# Patient Record
Sex: Female | Born: 1965 | Race: White | Hispanic: Yes | Smoking: Never smoker
Health system: Southern US, Community
[De-identification: ages and names within clinical notes are randomized; demographics above are authoritative.]

## PROBLEM LIST (undated history)

## (undated) DIAGNOSIS — D649 Anemia, unspecified: Secondary | ICD-10-CM

## (undated) HISTORY — DX: Anemia, unspecified: D64.9

---

## 2002-10-10 ENCOUNTER — Emergency Department (HOSPITAL_COMMUNITY): Admission: EM | Admit: 2002-10-10 | Discharge: 2002-10-10 | Payer: Self-pay | Admitting: Emergency Medicine

## 2004-10-01 ENCOUNTER — Encounter: Admission: RE | Admit: 2004-10-01 | Discharge: 2004-10-01 | Payer: Self-pay | Admitting: Orthopedic Surgery

## 2008-10-19 DIAGNOSIS — D649 Anemia, unspecified: Secondary | ICD-10-CM

## 2008-10-19 HISTORY — DX: Anemia, unspecified: D64.9

## 2008-10-19 HISTORY — PX: OTHER SURGICAL HISTORY: SHX169

## 2009-05-18 ENCOUNTER — Encounter (INDEPENDENT_AMBULATORY_CARE_PROVIDER_SITE_OTHER): Payer: Self-pay | Admitting: General Surgery

## 2009-05-18 ENCOUNTER — Inpatient Hospital Stay (HOSPITAL_COMMUNITY): Admission: EM | Admit: 2009-05-18 | Discharge: 2009-05-19 | Payer: Self-pay | Admitting: Emergency Medicine

## 2010-01-13 ENCOUNTER — Emergency Department (HOSPITAL_COMMUNITY): Admission: EM | Admit: 2010-01-13 | Discharge: 2010-01-13 | Payer: Self-pay | Admitting: Emergency Medicine

## 2011-01-25 LAB — URINALYSIS, ROUTINE W REFLEX MICROSCOPIC
Bilirubin Urine: NEGATIVE
Glucose, UA: NEGATIVE mg/dL
Hgb urine dipstick: NEGATIVE
Ketones, ur: NEGATIVE mg/dL
Nitrite: NEGATIVE
Protein, ur: NEGATIVE mg/dL
Specific Gravity, Urine: 1.024 (ref 1.005–1.030)
Urobilinogen, UA: 0.2 mg/dL (ref 0.0–1.0)
pH: 7 (ref 5.0–8.0)

## 2011-01-25 LAB — POCT CARDIAC MARKERS
CKMB, poc: <1 ng/mL — ABNORMAL LOW (ref 1.0–8.0)
CKMB, poc: <1 ng/mL — ABNORMAL LOW (ref 1.0–8.0)
Myoglobin, poc: 37.5 ng/mL (ref 12–200)
Myoglobin, poc: 48.5 ng/mL (ref 12–200)
Troponin i, poc: <0.05 ng/mL (ref 0.00–0.09)
Troponin i, poc: <0.05 ng/mL (ref 0.00–0.09)

## 2011-01-25 LAB — COMPREHENSIVE METABOLIC PANEL
ALT: 33 U/L (ref 0–35)
AST: 71 U/L — ABNORMAL HIGH (ref 0–37)
Albumin: 3.9 g/dL (ref 3.5–5.2)
Alkaline Phosphatase: 80 U/L (ref 39–117)
BUN: 12 mg/dL (ref 6–23)
CO2: 24 mEq/L (ref 19–32)
Calcium: 9.3 mg/dL (ref 8.4–10.5)
Chloride: 106 mEq/L (ref 96–112)
Creatinine, Ser: 0.79 mg/dL (ref 0.4–1.2)
GFR calc Af Amer: >60 mL/min (ref >60)
GFR calc non Af Amer: >60 mL/min (ref >60)
Glucose, Bld: 120 mg/dL — ABNORMAL HIGH (ref 70–99)
Potassium: 4 mEq/L (ref 3.5–5.1)
Sodium: 136 mEq/L (ref 135–145)
Total Bilirubin: 0.2 mg/dL — ABNORMAL LOW (ref 0.3–1.2)
Total Protein: 6.9 g/dL (ref 6.0–8.3)

## 2011-01-25 LAB — POCT PREGNANCY, URINE: Preg Test, Ur: NEGATIVE

## 2011-01-25 LAB — DIFFERENTIAL
Basophils Absolute: 0.1 10*3/uL (ref 0.0–0.1)
Basophils Relative: 1 % (ref 0–1)
Eosinophils Absolute: 0 10*3/uL (ref 0.0–0.7)
Eosinophils Relative: 0 % (ref 0–5)
Lymphocytes Relative: 11 % — ABNORMAL LOW (ref 12–46)
Lymphs Abs: 1.7 10*3/uL (ref 0.7–4.0)
Monocytes Absolute: 0.9 10*3/uL (ref 0.1–1.0)
Monocytes Relative: 6 % (ref 3–12)
Neutro Abs: 12.5 10*3/uL — ABNORMAL HIGH (ref 1.7–7.7)
Neutrophils Relative %: 82 % — ABNORMAL HIGH (ref 43–77)

## 2011-01-25 LAB — CBC
HCT: 34.4 % — ABNORMAL LOW (ref 36.0–46.0)
Hemoglobin: 10.5 g/dL — ABNORMAL LOW (ref 12.0–15.0)
MCHC: 30.4 g/dL (ref 30.0–36.0)
MCV: 68.5 fL — ABNORMAL LOW (ref 78.0–100.0)
Platelets: 393 10*3/uL (ref 150–400)
RBC: 5.02 MIL/uL (ref 3.87–5.11)
RDW: 17.9 % — ABNORMAL HIGH (ref 11.5–15.5)
WBC: 15.2 10*3/uL — ABNORMAL HIGH (ref 4.0–10.5)

## 2011-01-25 LAB — LIPASE, BLOOD: Lipase: 47 U/L (ref 11–59)

## 2011-03-03 NOTE — Op Note (Signed)
NAMEJASHAE, Mallory Washington            ACCOUNT NO.:  [id number]   MEDICAL RECORD NO.:  [id number]          PATIENT TYPE:  INP   LOCATION:  1533                         FACILITY:  [facility]   PHYSICIAN:  Sharlet Salina T. Hoxworth, M.D.DATE OF BIRTH:  1966/02/01   DATE OF PROCEDURE:  05/18/2009  DATE OF DISCHARGE:                               OPERATIVE REPORT   PREOPERATIVE DIAGNOSES:  Cholelithiasis, acute cholecystitis.   POSTOPERATIVE DIAGNOSES:  Cholelithiasis, acute cholecystitis.   SURGICAL PROCEDURES:  Laparoscopic cholecystectomy with intraoperative  cholangiogram.   SURGEON:  Dr. Johna Sheriff.   ASSISTANT:  Dr. Karie Soda.   ANESTHESIA:  General.   BRIEF HISTORY:  Ms. Warnick is a 45 year old female who presents with  acute right upper quadrant abdominal pain and tenderness persistent  after treatment in the emergency room.  Gallbladder ultrasound shows  multiple gallstones and some thickening of the gallbladder wall.  I have  recommended proceeding with laparoscopic cholecystectomy with  cholangiogram.  Through an interpreter, the nature of the procedure, its  indications, risks of anesthetic risks, bleeding, infection and bile  leak were discussed and understood.  She is now brought to the operating  room for this procedure.   DESCRIPTION OF OPERATION:  The patient was brought to the operating room  and placed in the supine position on the operating table and general  orotracheal anesthesia was induced.  She received preoperative IV  antibiotics and PAS were in place.  Correct patient and procedure were  verified.  The abdomen was widely sterilely prepped and draped.  Local  anesthesia was used to infiltrate the trocar sites.  An open Hassan  technique with a 10 mm trocar was used at the umbilicus and the standard  four-port placement used.  The gallbladder was edematous, somewhat  thickened and packed with stones.  The fundus was grasped and elevated  over the liver and then  retracted inferolaterally.  Some chronic  adhesions of omentum and the duodenum were carefully taken down.  The  infundibulum was then retracted inferolaterally.  Peritoneum anterior-  posterior to Calot's triangle was incised.  Fibrofatty tissue was  stripped off the neck of the gallbladder toward the porta hepatis.  The  cystic artery, actually the anterior and posterior branch, were isolated  and Calot's triangle divided between proximal and distal clips.  The  gallbladder cystic junction was dissected 360 degrees.  When the anatomy  was clear, the cystic duct was clipped at the gallbladder junction and  operative cholangiogram obtained through the cystic duct.  This showed  good filling of the normal common bile duct and intrahepatic ducts and  free flow into the duodenum and no filling defects.  Following this, the  cholangiocath was removed and the cystic duct was doubly clipped  proximally and divided.  The gallbladder was then dissected free from  its bed using hook cautery.  This was placed in an EndoCatch bag and  brought out the umbilical incision.  Multiple large stones were removed  and the gallbladder extracted.  The mattress suture was secured at the  umbilicus.  Skin incisions were closed with subcuticular Monocryl and  Dermabond.  Sponges counts were correct.  The patient was taken to the  recovery room in good condition.      Lorne Skeens. Hoxworth, M.D.  Electronically Signed     BTH/MEDQ  D:  05/18/2009  T:  05/18/2009  Job:  [id number]

## 2011-03-03 NOTE — H&P (Signed)
Mallory, Washington            ACCOUNT NO.:  [id number]   MEDICAL RECORD NO.:  [id number]          PATIENT TYPE:  EMS   LOCATION:  ED                           FACILITY:  [facility]   PHYSICIAN:  Lorne Skeens. Hoxworth, M.D.DATE OF BIRTH:  October 27, 1965   DATE OF ADMISSION:  05/17/2009  DATE OF DISCHARGE:                              HISTORY & PHYSICAL   CHIEF COMPLAINT:  Abdominal pain, nausea, vomiting.   HISTORY:  [name] is a 45 year old Hispanic female who had the  sudden onset today of severe epigastric and right upper quadrant  abdominal pain.  This was associated soon after with nausea and  vomiting.  The pain has persisted, described as quite severe, only  partially relieved with medications in the emergency room.  She has been  having some intermittent milder episodes of pain self-limited over the  last several months for which she has not sought medical attention.  Bowel movements are normal.  No fever, chills, jaundice.  No previous GI  or abdominal history.   PAST MEDICAL HISTORY:  Denies previous hospitalizations, serious illness  or surgery.   MEDICATIONS:  None.   ALLERGIES:  NONE.   SOCIAL HISTORY:  Not employed.  Does not smoke cigarettes or drink  alcohol.   FAMILY HISTORY:  Noncontributory.   REVIEW OF SYSTEMS:  GENERAL:  No fever, chills, weight stable.  RESPIRATORY:  No shortness of breath, cough, history of lung disease.  CARDIAC:  No history of heart disease, chest pain, palpitations,  swelling.  ABDOMEN/GI:  As above.  GU:  No urinary burning, frequency.  MUSCULOSKELETAL:  No joint pain, no swelling.  NEUROLOGIC:  No numbness,  weakness, syncope.   PHYSICAL EXAMINATION:  GENERAL:  She is a mildly overweight female,  somewhat drowsy with pain medication, uncomfortable.  SKIN:  Warm, dry.  No rash or infection.  HEENT:  Sclerae nonicteric.  Oropharynx clear.  No thyromegaly or  masses.  LYMPH NODES:  No cervical, subclavicular or inguinal nodes  palpable.  LUNGS:  Clear without wheezing or increased work of breathing.  CARDIAC:  Regular rate and rhythm.  No murmurs.  No edema.  Peripheral  pulse intact.  ABDOMEN:  Nondistended.  No scars.  No hernias.  There is  well-localized epigastric and right upper quadrant tenderness with  guarding.  No discernible masses or organomegaly.  EXTREMITIES:  No joint swelling or deformity.  NEUROLOGIC:  Alert, oriented x4.  Motor and sensory exams are grossly  normal.   LABORATORY:  White count elevated at 15.2, hemoglobin 10.5.  Pregnancy  negative.  UA negative.  Electrolytes normal.  LFTs abnormal only for  SGOT of 71.   DIAGNOSTICS:  Ultrasound of the gallbladder is reviewed which shows  multiple gallstones and mild thickening of the gallbladder wall, normal  common bile duct.   ASSESSMENT/PLAN:  Cholelithiasis and probable early acute cholecystitis.  The patient has persistent pain and tenderness after treatment in the  emergency room and elevated white count.  She will be admitted for IV  fluid, antibiotics and urgent cholecystectomy.      Lorne Skeens. Hoxworth, M.D.  Electronically  Signed     BTH/MEDQ  D:  05/17/2009  T:  05/18/2009  Job:  [id number]

## 2012-10-13 ENCOUNTER — Emergency Department (HOSPITAL_COMMUNITY): Payer: Self-pay

## 2012-10-13 ENCOUNTER — Encounter (HOSPITAL_COMMUNITY): Payer: Self-pay | Admitting: Emergency Medicine

## 2012-10-13 ENCOUNTER — Emergency Department (HOSPITAL_COMMUNITY)
Admission: EM | Admit: 2012-10-13 | Discharge: 2012-10-13 | Disposition: A | Discharge status: Home / Self Care | Payer: Self-pay | Attending: Emergency Medicine | Admitting: Emergency Medicine

## 2012-10-13 DIAGNOSIS — W108XXA Fall (on) (from) other stairs and steps, initial encounter: Secondary | ICD-10-CM | POA: Insufficient documentation

## 2012-10-13 DIAGNOSIS — S139XXA Sprain of joints and ligaments of unspecified parts of neck, initial encounter: Secondary | ICD-10-CM | POA: Insufficient documentation

## 2012-10-13 DIAGNOSIS — Z87442 Personal history of urinary calculi: Secondary | ICD-10-CM | POA: Insufficient documentation

## 2012-10-13 DIAGNOSIS — S161XXA Strain of muscle, fascia and tendon at neck level, initial encounter: Secondary | ICD-10-CM

## 2012-10-13 DIAGNOSIS — Y929 Unspecified place or not applicable: Secondary | ICD-10-CM | POA: Insufficient documentation

## 2012-10-13 DIAGNOSIS — Y939 Activity, unspecified: Secondary | ICD-10-CM | POA: Insufficient documentation

## 2012-10-13 MED ORDER — HYDROCODONE-ACETAMINOPHEN 5-325 MG PO TABS: 1.0000 | ORAL_TABLET | Freq: Four times a day (QID) | ORAL | Status: DC | PRN

## 2012-10-13 MED ORDER — IBUPROFEN 800 MG PO TABS: 800.0000 mg | ORAL_TABLET | Freq: Three times a day (TID) | ORAL | Status: DC | PRN

## 2012-10-13 NOTE — ED Provider Notes (Signed)
History   This chart was scribed for Ebbie Ridge, PA, by Leone Payor, ED Scribe. This patient was seen in room WTR9/WTR9 and the patient's care was started at 1137.   CSN: [id number]  Arrival date & time 10/13/12  1041   First MD Initiated Contact with Patient 10/13/12 1137      Chief Complaint  Patient presents with  . Fall  . Neck Pain     The history is provided by the patient. No language interpreter was used.    Adrianna Dudas is a 46 y.o. female who presents to the Emergency Department complaining of new, constant, unchanged neck pain that radiates down the arm starting 2 weeks ago after falling down two stairs. Pt denies LOC, vomiting, nausea, lower back pain, numbness, or weakness. Patient has no used any treatment prior to arrival. The patient states that she did not take anything prior to arrival. Movement makes the pain worse.   Pt denies smoking and alcohol use. History reviewed. No pertinent past medical history.  Past Surgical History  Procedure Date  . Kidney stone removal     History reviewed. No pertinent family history.  History  Substance Use Topics  . Smoking status: Never Smoker   . Smokeless tobacco: Not on file  . Alcohol Use: No    No OB history provided.   Review of Systems All other systems negative except as documented in the HPI. All pertinent positives and negatives as reviewed in the HPI.      Allergies  Review of patient's allergies indicates no known allergies.  Home Medications  No current outpatient prescriptions on file.  BP 108/71  Pulse 75  Temp 98.6 F (37 C) (Oral)  Resp 18  SpO2 98%  LMP 09/05/2012  Physical Exam  Nursing note and vitals reviewed. Constitutional: She is oriented to person, place, and time. She appears well-developed and well-nourished. No distress.  HENT:  Head: Normocephalic and atraumatic.  Eyes: Pupils are equal, round, and reactive to light.  Neck: Normal range of motion. Neck supple.  No tracheal deviation present.  Cardiovascular: Normal rate, regular rhythm and normal heart sounds.   Pulmonary/Chest: Effort normal and breath sounds normal. No respiratory distress.  Musculoskeletal: Normal range of motion.       Cervical back: She exhibits tenderness and pain. She exhibits normal range of motion, no bony tenderness, no deformity and no spasm.       Tenderness along lateral left neck.  Strength is normal, sensation is normal.     Lower and mid back are non tender.  Neurological: She is alert and oriented to person, place, and time. She has normal reflexes. She displays normal reflexes. She exhibits normal muscle tone. Coordination normal.  Skin: Skin is warm and dry.  Psychiatric: She has a normal mood and affect. Her behavior is normal.    ED Course  Procedures (including critical care time)  DIAGNOSTIC STUDIES: Oxygen Saturation is 98% on room air, normal by my interpretation.    COORDINATION OF CARE:  12:19 PMDiscussed treatment plan which includes use of hot compreses with pt at bedside and pt agreed to plan. Patient has no neurological deficits . She also has normal reflexes as well. The patient is asked to follow up at an urgent care.      MDM  I personally performed the services described in this documentation, which was scribed in my presence. The recorded information has been reviewed and is accurate.    Carlyle Dolly,  PA-C 10/13/12 1234

## 2012-10-13 NOTE — ED Provider Notes (Signed)
Medical screening examination/treatment/procedure(s) were performed by non-physician practitioner and as supervising physician I was immediately available for consultation/collaboration.   Prescilla Monger M Lanesha Azzaro, MD 10/13/12 1615 

## 2012-10-13 NOTE — ED Notes (Signed)
Patient states that she feel about 2 weeks ago, she started to feel some swelling to her left neck last night and started to have pain.

## 2016-04-03 ENCOUNTER — Ambulatory Visit (INDEPENDENT_AMBULATORY_CARE_PROVIDER_SITE_OTHER): Payer: Self-pay | Admitting: Internal Medicine

## 2016-04-03 ENCOUNTER — Encounter: Payer: Self-pay | Admitting: Internal Medicine

## 2016-04-03 VITALS — BP 108/72 | HR 77 | Resp 16 | Ht 60.5 in | Wt 176.0 lb

## 2016-04-03 DIAGNOSIS — N950 Postmenopausal bleeding: Secondary | ICD-10-CM

## 2016-04-03 DIAGNOSIS — Z658 Other specified problems related to psychosocial circumstances: Secondary | ICD-10-CM

## 2016-04-03 DIAGNOSIS — K029 Dental caries, unspecified: Secondary | ICD-10-CM

## 2016-04-03 DIAGNOSIS — F439 Reaction to severe stress, unspecified: Secondary | ICD-10-CM

## 2016-04-03 DIAGNOSIS — G44209 Tension-type headache, unspecified, not intractable: Secondary | ICD-10-CM

## 2016-04-03 DIAGNOSIS — H7293 Unspecified perforation of tympanic membrane, bilateral: Secondary | ICD-10-CM

## 2016-04-03 NOTE — Progress Notes (Signed)
Subjective:    Patient ID: Mallory Washington, female    DOB: 10/21/1965, 50 y.o.   MRN: [id number]  HPI  New patient  1.  Headaches:  Started 2 months ago.  Bifrontal.  Feels like her head will explode. Denies throbbing.   Having the headaches twice weekly.  Last 10-20 minutes (later states 2 hours), lies down, may fall asleep, and when awakens, headache is either gone or better.   Has not taken any medication for the headache. No aura.   No photophobia or phonophobia. No nausea or vomiting.   No visual changes Does have glasses that she feels work for her. No associated numbness, tingling, or weakness in her body elsewhere. No precipitating factors of which she is aware. Little to know caffeine intake/chocolate during the day. Denies stress.   PHQ9 measures 13. Husband died of cancer in 2010. 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 Does not feel like supervisor understands.  2.  Postmenopausal bleeding:  Has not had a period for 4 years and then had a period last week, beginning, June 9th.  Lasted through June 11th.  Did not really go through night sweats or hot flashes. Did not have any premenstrual symptoms with this.  Bleeding was heavy though denies passing clots. No pelvic discomfort.  No intercourse before bleeding started--not in some time.   3.  Had some heartburn and nausea 2 days ago.  Not a recurring problem. Her symptoms have resolved.   No current outpatient prescriptions on file.   No Known Allergies   Past Medical History  Diagnosis Date  . Anemia 2010    from menometrorrhagia at the time   Past Surgical History  Procedure Laterality Date  . Kidney stone removal Right 2010   Family History  Problem Relation Age of Onset  . Cancer Father     prostate cancer  . Diabetes Sister    Social History   Social History  . Marital Status: Widowed    Spouse Name: N/A  . Number of Children: 3  . Years of Education: 3    Occupational History  . Packaging at [employer]    Social History Main Topics  . Smoking status: Never Smoker   . Smokeless tobacco: Never Used  . Alcohol Use: No  . Drug Use: No  . Sexual Activity: No   Other Topics Concern  . Not on file   Social History Narrative   [place]   [place]   Widowed   Lives at home with son.          Review of Systems     Objective:   Physical Exam NAD HEENT:  PERRL, EOMI, discs sharp, TMs with possible perforations bilaterally, the left in particular appears to have a posterior perforation.  Both with scarring anteriorly.  Throat without injection, right upper posterior molar with cavity Neck:  Supple, no adenopathy, no thyromegaly Chest:  CTA CV:  RRR with normal S1 and S2, No S3, S4 or murmur, no carotid bruits, Carotid, radial and DP pulses normal and equal Abd:  Obese, + BS, No HSM or masses, NT.   GU:  NOrmal external genitalia;  Scant old blood in vaginal canal.  Unable to fully visualize cervix.  On bimanual, unable to make contact with cervix and due to abdominal size, unable to adequately feel top of uterine fundus.  NT, No adnexal mass or tenderness. Neuro:  A and O x  3, CN II-XII grossly intact, Motor 5/5, DTRs 2+/4, sensory grossly normal.        Assessment & Plan:  1.  Postmenopausal bleeding:  Exam suboptimal.  Check pelvic ultrasound.  LIkely will need Gyn referral and endometrial biopsy.  2.  Headache:  sounds like a muscle tension HA.  Recommend Ibuprofen as needed as well as counseling with N. Terrilee Croak, LCSW.  Unable to perform warm hand off today.  No concerning findings in history or on exam  3.  GI complaints:  resolved  4.  Perforated TMs:  ENT referral.  5.  Dental cavity:  Dental referral.

## 2016-04-03 NOTE — Patient Instructions (Signed)
Ibuprofen 200 mg pastillas, 2-4 pastillas cada 6 horas a necesita para dolor de Turkmenistancabeza.  Siempre tome Ibuprofen con comida

## 2016-04-16 ENCOUNTER — Other Ambulatory Visit: Payer: PRIVATE HEALTH INSURANCE | Admitting: Licensed Clinical Social Worker

## 2016-04-20 ENCOUNTER — Ambulatory Visit (INDEPENDENT_AMBULATORY_CARE_PROVIDER_SITE_OTHER): Payer: Self-pay | Admitting: Licensed Clinical Social Worker

## 2016-04-20 DIAGNOSIS — F439 Reaction to severe stress, unspecified: Secondary | ICD-10-CM

## 2016-04-20 DIAGNOSIS — Z658 Other specified problems related to psychosocial circumstances: Secondary | ICD-10-CM

## 2016-04-22 NOTE — Progress Notes (Signed)
   THERAPY PROGRESS NOTE  Session Time: 45min  Participation Level: Active  Behavioral Response: Neat and Well GroomedAlertDepressed  Type of Therapy: Individual Therapy  Treatment Goals addressed: Coping  Interventions: Motivational Interviewing and Supportive  Summary: Mallory MayerDelfina Washington is a 50 y.o. female who presents with a slightly depressed mood and appropriate affect. She reported that she is seeking counseling due to ongoing stress and depression related to the death of her husband in 2010. She shared about the many ways that he was a good husband and father to their three children. She expressed her continued hurt and anger towards him, though, for the way that he acted during the last months of his cancer. Mallory Washington shared that her husband had started to cancel his doctor's appointments but lied to the family, telling them that he had attended and that he was getting better when in fact he was getting much worse. She shared that even in the last few days before he died, he was trying to lie to her about the seriousness of his condition. Mallory Washington reported that she lost the house after his death because she could not pay the mortgage. She shared that she and her children are slowly picking up the pieces and moving on with their lives, though it has been very difficult.    Suicidal/Homicidal: Nowithout intent/plan  Therapist Response: LCSW began the clinical assessment but was unable to finish due to time constraints. LCSW utilized supportive counseling techniques throughout the session in order to validate emotions and encourage open expression of emotion. LCSW and Mallory Washington processed about her grief process and the impact of her husband's death on the whole family.  Plan: Return again in 2 weeks.  Diagnosis: Axis I: See current hospital problem list    Axis II: No diagnosis    Mallory Simmeratosha Vastie Douty, LCSW 04/22/2016

## 2016-05-04 ENCOUNTER — Other Ambulatory Visit: Payer: PRIVATE HEALTH INSURANCE | Admitting: Licensed Clinical Social Worker

## 2016-05-22 ENCOUNTER — Ambulatory Visit (INDEPENDENT_AMBULATORY_CARE_PROVIDER_SITE_OTHER): Payer: Self-pay | Admitting: Internal Medicine

## 2016-05-22 ENCOUNTER — Ambulatory Visit: Payer: PRIVATE HEALTH INSURANCE | Admitting: Internal Medicine

## 2016-05-22 ENCOUNTER — Encounter: Payer: Self-pay | Admitting: Internal Medicine

## 2016-05-22 VITALS — BP 104/64 | HR 64 | Resp 16 | Ht 61.0 in | Wt 177.0 lb

## 2016-05-22 DIAGNOSIS — H547 Unspecified visual loss: Secondary | ICD-10-CM

## 2016-05-22 DIAGNOSIS — K056 Periodontal disease, unspecified: Secondary | ICD-10-CM

## 2016-05-22 NOTE — Progress Notes (Signed)
   Subjective:    Patient ID: Mallory Washington, female    DOB: [birth year], 50 y.o.   MRN: [id number]  HPI   1.  Dental concerns:  Discussed that we did send in the referral in June.  We will check to see where she is on the waiting list.   Gums bleeding when she brushes her teeth.  Has had this for the past 3 weeks.   Having some pain , but better with Tylenol  500 mg twice daily as needed.   2.  Vision concern;  Has to hold reading material away from her face to be able to focus.  Has dealt with this for 1 month  3.  No longer having heavy periods, actually no periods at all.    Current Meds  Medication Sig  . acetaminophen (TYLENOL) 500 MG tablet Take 1,000 mg by mouth daily.   No Known Allergies    Review of Systems     Objective:   Physical Exam  NAD HEENT:  PERRL, EOMI, discs sharp, Oral exam with mildly inflamed and swollen gingiva.  No fluctuance. Neck:  Supple, no adenopathy Chest:  CTA CV:  RRR without murmur or rub, radial pulses normal and equal          Assessment & Plan:  1.   Periodontal Disease:  Checking into why such a delay with dental referral.  In meantime, to consider going to [facility] dental hygienist school for cleaning.  Brush twice daily and floss daily  2.  Visual complaints:  Optometry evaluation.  Likely presbypia  3.  Previous postmenopausal bleeding:  Still waiting for [facility] slot for pelvic U/S, though no longer with bleeding.

## 2016-05-22 NOTE — Patient Instructions (Addendum)
Try [employer] dental hygienist school for cleaning while waiting for dentist referral Call if you do not hear about the pelvic ultrasound in next month

## 2016-05-31 ENCOUNTER — Telehealth: Payer: Self-pay

## 2016-05-31 NOTE — Telephone Encounter (Signed)
[facility] do I need to try to schedule it with [facility]?

## 2016-06-03 NOTE — Telephone Encounter (Signed)
Grover CanavanKrystal, I have no information regarding this referral appointment to date. Thank you, Dennie BiblePat

## 2016-07-03 NOTE — Telephone Encounter (Signed)
Will this have to go back out on the first of October?

## 2017-11-30 ENCOUNTER — Emergency Department (HOSPITAL_COMMUNITY)
Admission: EM | Admit: 2017-11-30 | Discharge: 2017-11-30 | Disposition: A | Discharge status: Home / Self Care | Payer: Self-pay | Attending: Emergency Medicine | Admitting: Emergency Medicine

## 2017-11-30 ENCOUNTER — Encounter (HOSPITAL_COMMUNITY): Payer: Self-pay

## 2017-11-30 ENCOUNTER — Emergency Department (HOSPITAL_COMMUNITY): Payer: Self-pay

## 2017-11-30 ENCOUNTER — Other Ambulatory Visit: Payer: Self-pay

## 2017-11-30 DIAGNOSIS — Y929 Unspecified place or not applicable: Secondary | ICD-10-CM | POA: Insufficient documentation

## 2017-11-30 DIAGNOSIS — Y939 Activity, unspecified: Secondary | ICD-10-CM | POA: Insufficient documentation

## 2017-11-30 DIAGNOSIS — Y999 Unspecified external cause status: Secondary | ICD-10-CM | POA: Insufficient documentation

## 2017-11-30 DIAGNOSIS — S8264XA Nondisplaced fracture of lateral malleolus of right fibula, initial encounter for closed fracture: Secondary | ICD-10-CM | POA: Insufficient documentation

## 2017-11-30 DIAGNOSIS — X500XXA Overexertion from strenuous movement or load, initial encounter: Secondary | ICD-10-CM | POA: Insufficient documentation

## 2017-11-30 MED ORDER — TRAMADOL HCL 50 MG PO TABS: 50.0000 mg | ORAL_TABLET | Freq: Four times a day (QID) | ORAL | 0 refills | Status: AC | PRN

## 2017-11-30 MED ORDER — TRAMADOL HCL 50 MG PO TABS
50.0000 mg | ORAL_TABLET | Freq: Once | ORAL | Status: AC
  Administered 2017-11-30: 50 mg via ORAL
  Filled 2017-11-30: qty 1

## 2017-11-30 NOTE — ED Triage Notes (Signed)
Patient reports that she fell in the bathtub a few months ago and is now experiencing pain and swelling. Pain is worse with weight bearing.

## 2017-11-30 NOTE — ED Notes (Addendum)
Pt reports that she fell in the shower in October, and has been having intermittent pain but pt dtr states that she has been putting off being evaluated as she did not believe that it was serious. Pt reports 4/10, pt denies taking medication for pain.Rt ankle appears swollen and pt reports some numbness.

## 2017-11-30 NOTE — ED Provider Notes (Signed)
[facility]-EMERGENCY DEPT Provider Note   CSN: [id number] Arrival date & time: 11/30/17  1752     History   Chief Complaint Chief Complaint  Patient presents with  . Ankle Pain    HPI Mallory Washington is a 52 y.o. female who presents to the emergency department today for right ankle pain.  Patient notes that she fell in the shower in October, causing an inversion type injury to her right ankle.  Since then she has had pain and swelling of the lateral malleolus that has worsened with ambulation.  She notes that she has had intermittent pain to this area but is put off seeing a doctor because she did not think it was serious.  The patient notes a few days ago she rolled the ankle again which cause a large increase in her pain which is why she presents today.  She reports her current pain level is 4/10.  She has not been taking anything for her symptoms.  She does report some swelling to the lateral malleolus and some tingling/numbness along the fourth and fifth digit.  She denies any fever or weakness.  HPI  Past Medical History:  Diagnosis Date  . Anemia 2010   from menometrorrhagia at the time    There are no active problems to display for this patient.   Past Surgical History:  Procedure Laterality Date  . kidney stone removal Right 2010    OB History    No data available       Home Medications    Prior to Admission medications   Medication Sig Start Date End Date Taking? Authorizing Provider  acetaminophen (TYLENOL) 500 MG tablet Take 1,000 mg by mouth daily.    [provider]    Family History Family History  Problem Relation Age of Onset  . Cancer Father        prostate cancer  . Diabetes Sister     Social History Social History   Tobacco Use  . Smoking status: Never Smoker  . Smokeless tobacco: Never Used  Substance Use Topics  . Alcohol use: No    Alcohol/week: 0.0 oz  . Drug use: No     Allergies   Patient has  no known allergies.   Review of Systems Review of Systems  Musculoskeletal: Positive for arthralgias and joint swelling.  Skin: Negative for color change and wound.  Neurological: Negative for weakness and numbness.     Physical Exam Updated Vital Signs BP 127/79 (BP Location: Right Arm)   Pulse 76   Temp 97.8 F (36.6 C) (Oral)   Resp 16   Ht 5\' 4"  (1.626 m)   Wt 72.6 kg (160 lb)   LMP 02/03/2016 (Approximate)   SpO2 98%   BMI 27.46 kg/m   Physical Exam  Constitutional: She appears well-developed and well-nourished.  HENT:  Head: Normocephalic and atraumatic.  Right Ear: External ear normal.  Left Ear: External ear normal.  Eyes: Conjunctivae are normal. Right eye exhibits no discharge. Left eye exhibits no discharge. No scleral icterus.  Cardiovascular:  Pulses:      Dorsalis pedis pulses are 2+ on the right side.       Posterior tibial pulses are 2+ on the right side.  No edema of the lower legs.   Pulmonary/Chest: Effort normal. No respiratory distress.  Musculoskeletal:       Right knee: Normal. She exhibits normal range of motion. No tenderness found.       Right  ankle: She exhibits decreased range of motion (2/2 pain) and swelling (lateral malleolus). She exhibits no ecchymosis. Tenderness. Lateral malleolus tenderness found. Achilles tendon normal. Achilles tendon exhibits no pain and normal Thompson's test results.       Right foot: There is normal range of motion, no tenderness and no bony tenderness.  No TTP of the upper fibula. Neurovascularly intact distally. Compartments soft above and below affected joint.   Neurological: She is alert. She has normal strength. No sensory deficit.  Skin: Skin is warm, dry and intact. Capillary refill takes less than 2 seconds. No erythema. No pallor.  No skin erythema or heat.   Psychiatric: She has a normal mood and affect.  Nursing note and vitals reviewed.    ED Treatments / Results  Labs (all labs ordered are  listed, but only abnormal results are displayed) Labs Reviewed - No data to display  EKG  EKG Interpretation None       Radiology Dg Ankle Complete Right  Result Date: 11/30/2017 CLINICAL DATA:  52 year old female status post fall today with lateral ankle pain and swelling. Unable to bear weight. EXAM: RIGHT ANKLE - COMPLETE 3+ VIEW COMPARISON:  None. FINDINGS: Bone mineralization is within normal limits. Normal mortise joint alignment. Talar dome intact. Distal tibia appears intact. Questionable nondisplaced distal right fibula/lateral malleolus fracture, with cortical irregularity seen only on the lateral view (arrow). There is lateral right ankle soft tissue swelling. The talus and calcaneus appear intact. Visible right foot appears intact. No ankle joint effusion is identified. IMPRESSION: 1. Indeterminate nondisplaced fracture of the right lateral malleolus. Recommend ankle stabilization and follow-up radiographs (e.g. in several days or one week) to re-evaluate. 2. No other fracture or dislocation identified. Electronically Signed   By: Odessa Fleming M.D.   On: 11/30/2017 19:37    Procedures Procedures (including critical care time)  Medications Ordered in ED Medications  traMADol (ULTRAM) tablet 50 mg (50 mg Oral Given 11/30/17 2136)     Initial Impression / Assessment and Plan / ED Course  I have reviewed the triage vital signs and the nursing notes.  Pertinent labs & imaging results that were available during my care of the patient were reviewed by me and considered in my medical decision making (see chart for details).     52 y.o. female with right ankle pain after inversion ankle injury in October and last week. She is NVI. No open wounds noted. Patient X-Ray with indeterminate non-displaced fracture of the right lateral malleolus. Ankle mortius is in alignment.  No tenderness palpation of the proximal fibula to make me concern for maisonneuve fracture.  Pain managed in ED. Pt  advised to follow up with orthopedics for further evaluation and treatment. Recommend repeat xray in 1 week due to indeterminate reading. Patient given splint and crutches while in ED. Conservative therapy recommended and discussed. Short course of pain medication will be provided. Patient will be dc home & is agreeable with above plan. I have also discussed reasons to return immediately to the ER.  Patient expresses understanding and agrees with plan.  Final Clinical Impressions(s) / ED Diagnoses   Final diagnoses:  Closed nondisplaced fracture of lateral malleolus of right fibula, initial encounter    ED Discharge Orders        Ordered    traMADol (ULTRAM) 50 MG tablet  Every 6 hours PRN     11/30/17 2120       Jacinto Halim, Cordelia Poche 11/30/17 2209  Shaune PollackIsaacs, Cameron, MD 12/01/17 [phone]

## 2017-11-30 NOTE — Discharge Instructions (Signed)
You were seen here today for ankle pain. Your xray is consistent with fracture of the lateral malleolus. Follow attached handout. Wear cam walker boot until follow up with orthopedics. Recommended 1 week for follow up x ray. Please follow up with Ortho.   For pain control you may take: 800mg  of ibuprofen (that is usually four 200mg  over the counter pills) up to 3 times a day (please take with food) and acetaminophen 975mg  (this is 3 normal strength, 325mg , over the counter pills) up to four times a day. Please do not take more than this. Do not drink alcohol or combine with other medications that have acetaminophen or Ibuprofen as an ingredient (Read the labels!).    For breakthrough pain you may take Ultram. Do not drink alcohol drive or operate heavy machinery when taking. You are being provided a prescription for opiates (also known as narcotics) for pain control on an ?as needed? basis.  Opiates can be addictive and should only be used when absolutely necessary for pain control when other alternatives do not work.  We recommend you only use them for the recommended amount of time and only as prescribed.  Please do not take with other sedative medications or alcohol.  Please do not drive, operate machinery, or make important decisions while taking opiates.  Please note that these medications can be addictive and have high abuse potential.  Please keep these medications locked away from children, teenagers or any family members with history of substance abuse. Additionally, these medications may cause constipation - take over the counter stool softeners or add fiber to your diet to treat this (Metamucil, Psyllium Fiber, Colace, Miralax) Further refills will need to be obtained from your primary care doctor and will not be prescribed through the Emergency Department. You will test positive on most drug tests while taking this medication.   If you develop worsening or new concerning symptoms you can return to  the emergency department for re-evaluation.

## 2018-02-07 ENCOUNTER — Other Ambulatory Visit: Payer: Self-pay | Admitting: Family Medicine

## 2018-02-07 ENCOUNTER — Ambulatory Visit: Payer: Self-pay

## 2018-02-07 DIAGNOSIS — M25572 Pain in left ankle and joints of left foot: Secondary | ICD-10-CM

## 2019-10-19 ENCOUNTER — Other Ambulatory Visit: Payer: Self-pay

## 2019-10-23 ENCOUNTER — Ambulatory Visit: Payer: HRSA Program | Attending: Internal Medicine

## 2019-10-23 DIAGNOSIS — Z20822 Contact with and (suspected) exposure to covid-19: Secondary | ICD-10-CM

## 2019-10-23 DIAGNOSIS — U071 COVID-19: Secondary | ICD-10-CM | POA: Insufficient documentation

## 2019-10-24 LAB — NOVEL CORONAVIRUS, NAA: SARS-CoV-2, NAA: DETECTED — AB

## 2020-04-12 ENCOUNTER — Other Ambulatory Visit: Payer: Self-pay | Admitting: Urgent Care

## 2020-04-12 DIAGNOSIS — R413 Other amnesia: Secondary | ICD-10-CM

## 2020-04-24 ENCOUNTER — Other Ambulatory Visit: Payer: Self-pay | Admitting: Family

## 2020-04-24 DIAGNOSIS — R413 Other amnesia: Secondary | ICD-10-CM

## 2020-05-14 ENCOUNTER — Ambulatory Visit
Admission: RE | Admit: 2020-05-14 | Discharge: 2020-05-14 | Disposition: A | Discharge status: Home / Self Care | Payer: No Typology Code available for payment source | Source: Ambulatory Visit | Attending: Urgent Care | Admitting: Urgent Care

## 2020-05-14 DIAGNOSIS — R413 Other amnesia: Secondary | ICD-10-CM

## 2021-03-09 IMAGING — MR MR HEAD W/O CM
8 series · 48 of 48 positions shown · non-contrast
Comparison: None.

CLINICAL DATA: Memory loss.

EXAM:
MRI HEAD WITHOUT CONTRAST
TECHNIQUE: Multiplanar, multiecho pulse sequences of the brain and surrounding
structures were obtained without intravenous contrast.

[Series 3: T1 · sagittal · 5.0mm · 0.45mm/px · 2 of 21 slices shown]
[im 1/21]
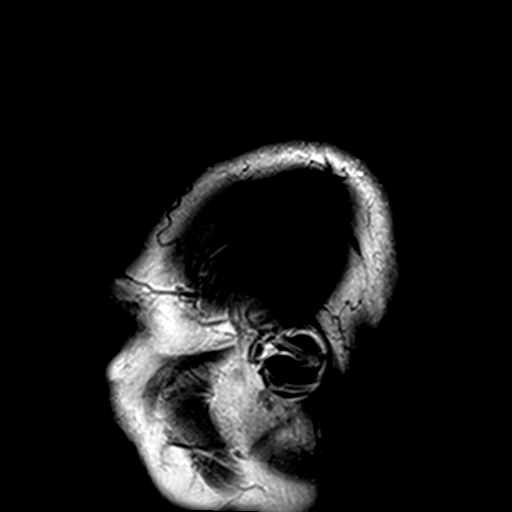
[im 21/21]
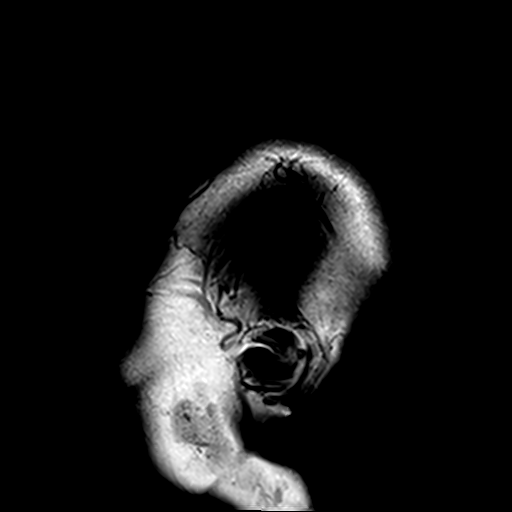

[Series 4: T2 · axial · 5.0mm · 0.51mm/px · z∈[-53,+83]mm · 2 of 22 slices shown (1 of 2)]
[im 1/22]
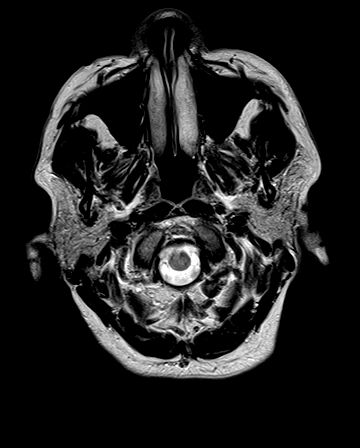
[im 22/22]
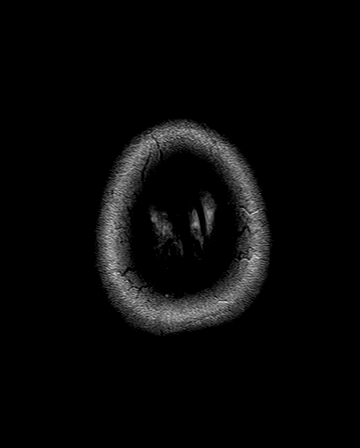

[Series 5: DWI · axial · 3.0mm · 1.80mm/px · z∈[-53,+82]mm · 10 of 92 slices shown (1 of 2)]
[im 1/92]
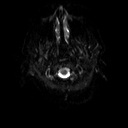
[im 11/92]
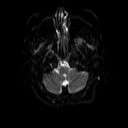
[im 21/92]
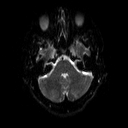
[im 31/92]
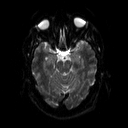
[im 41/92]
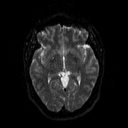
[im 51/92]
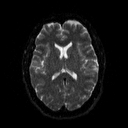
[im 61/92]
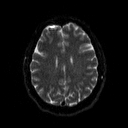
[im 71/92]
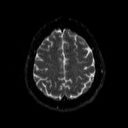
[im 81/92]
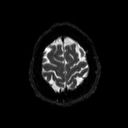
[im 92/92]
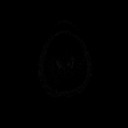

[Series 6: DWI · axial · 3.0mm · 1.80mm/px · z∈[-53,+82]mm · 5 of 46 slices shown (2 of 2)]
[im 1/46]
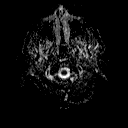
[im 12/46]
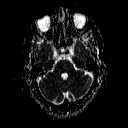
[im 23/46]
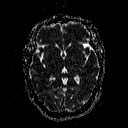
[im 34/46]
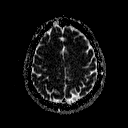
[im 46/46]
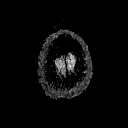

[Series 8: swi_images · axial · 2.0mm · 0.90mm/px · z∈[-56,+86]mm · 8 of 72 slices shown]
[im 1/72]
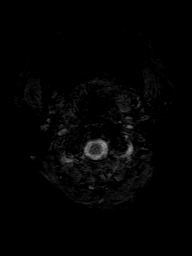
[im 11/72]
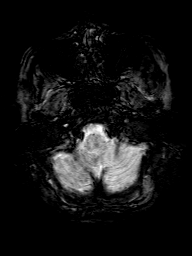
[im 21/72]
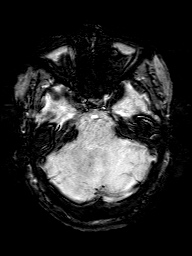
[im 31/72]
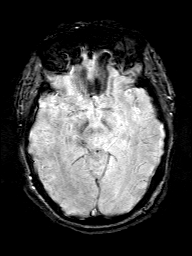
[im 41/72]
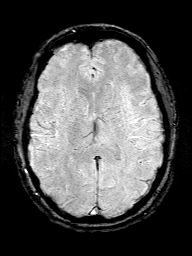
[im 51/72]
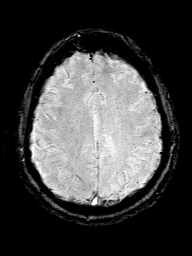
[im 61/72]
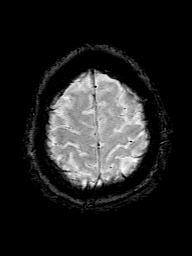
[im 72/72]
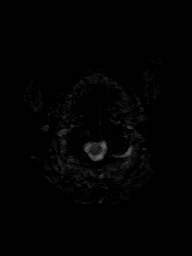

[Series 9: FLAIR · axial · 3.0mm · 0.45mm/px · z∈[-53,+82]mm · 3 of 30 slices shown]
[im 1/30]
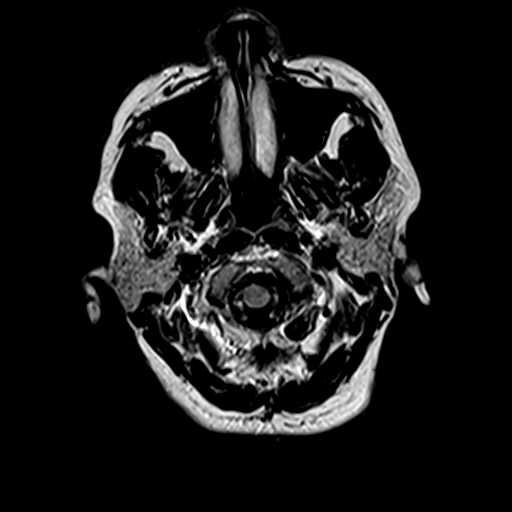
[im 15/30]
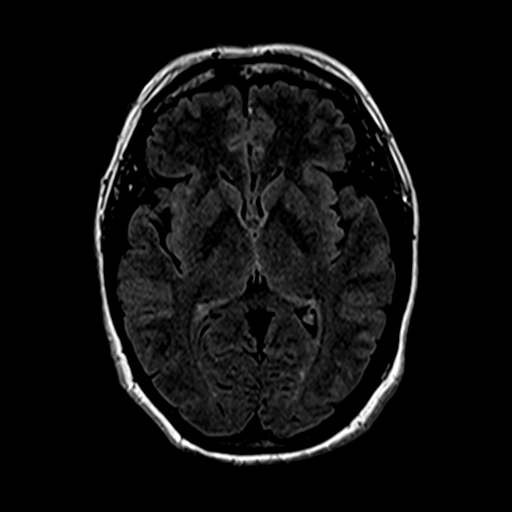
[im 30/30]
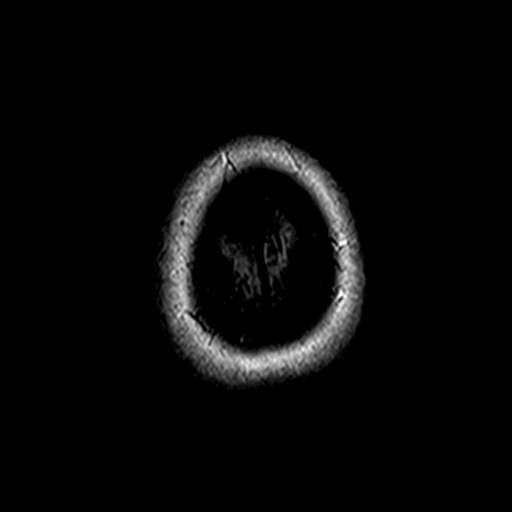

[Series 10: t1_mpr_tra · axial · 1.0mm · 0.75mm/px · z∈[-56,+87]mm · 15 of 144 slices shown]
[im 1/144]
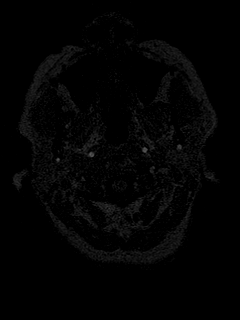
[im 11/144]
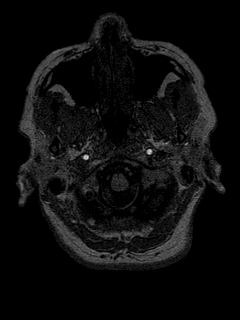
[im 21/144]
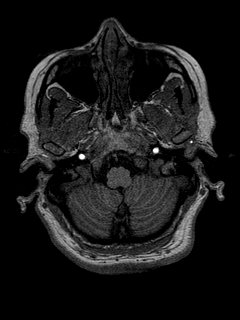
[im 31/144]
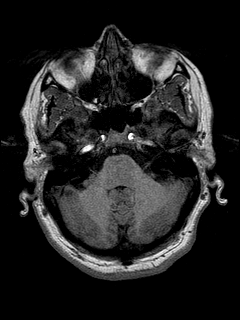
[im 41/144]
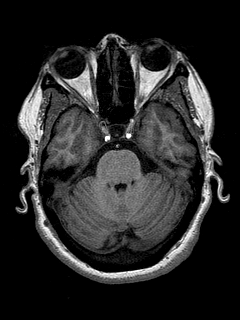
[im 52/144]
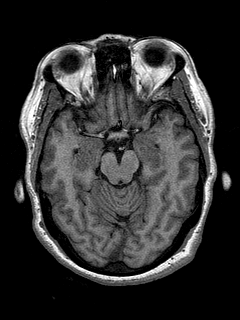
[im 62/144]
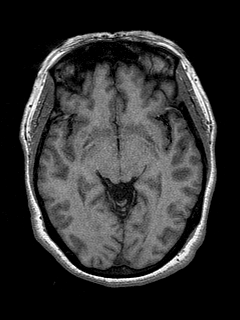
[im 72/144]
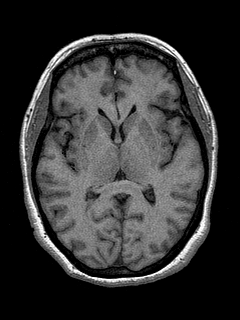
[im 82/144]
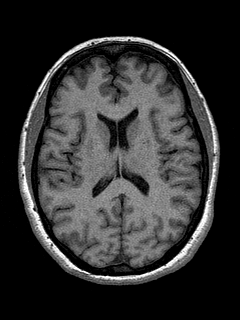
[im 92/144]
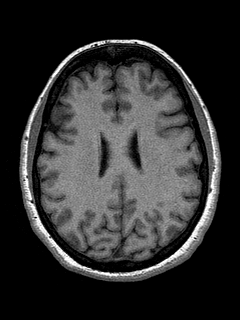
[im 103/144]
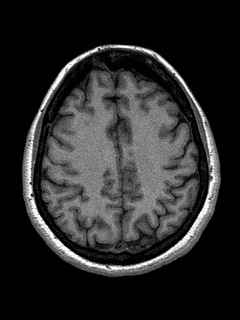
[im 113/144]
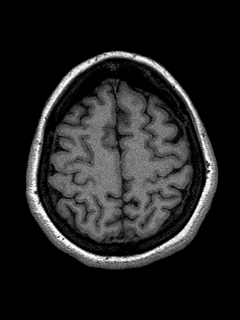
[im 123/144]
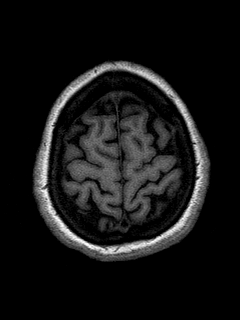
[im 133/144]
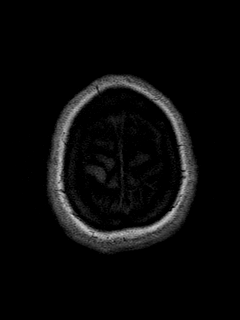
[im 144/144]
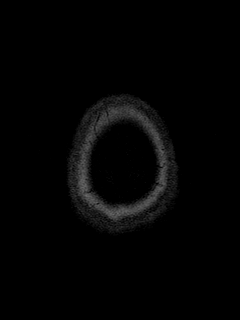

[Series 11: T2 · coronal · 5.0mm · 0.45mm/px · 3 of 24 slices shown (2 of 2)]
[im 1/24]
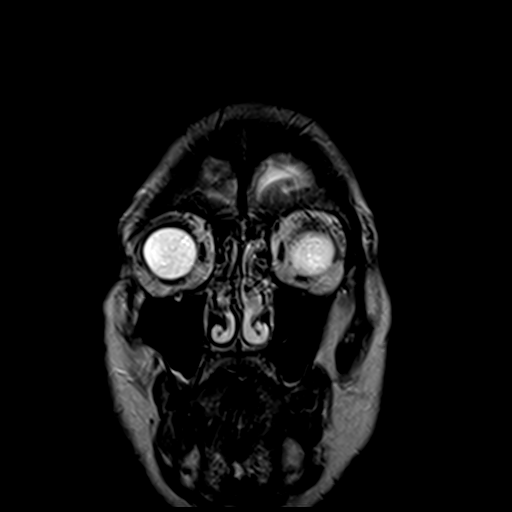
[im 12/24]
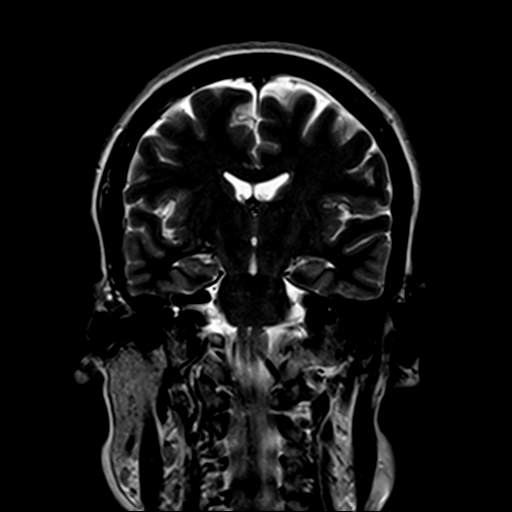
[im 24/24]
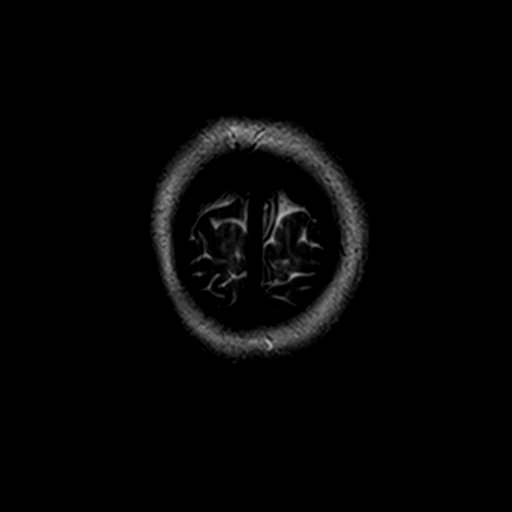

[48 of 48 positions shown; findings below may reference images not displayed]

FINDINGS: Brain: There is no evidence of an acute infarct, intracranial
hemorrhage, mass, midline shift, or extra-axial fluid collection.
There is a slightly expanded partially empty sella. The ventricles
and sulci are normal. The brain is normal in signal.

Vascular: Major intracranial vascular flow voids are preserved.

Skull and upper cervical spine: Unremarkable bone marrow signal.

Sinuses/Orbits: Unremarkable orbits. No significant inflammatory
changes in the paranasal sinuses. Clear mastoid air cells.

Other: None.
IMPRESSION: 1. Partially empty sella, likely incidental.
2. Otherwise unremarkable appearance of the brain.

## 2022-02-05 ENCOUNTER — Ambulatory Visit (HOSPITAL_COMMUNITY)
Admission: RE | Admit: 2022-02-05 | Discharge: 2022-02-05 | Disposition: A | Discharge status: Home / Self Care | Payer: No Typology Code available for payment source | Source: Ambulatory Visit | Attending: Neurology | Admitting: Neurology

## 2022-02-05 DIAGNOSIS — R569 Unspecified convulsions: Secondary | ICD-10-CM | POA: Insufficient documentation

## 2022-02-05 DIAGNOSIS — R404 Transient alteration of awareness: Secondary | ICD-10-CM

## 2022-02-05 NOTE — Procedures (Signed)
Patient Name: Mallory Washington  ?MRN: [id number]  ?Epilepsy Attending: Lora Havens  ?Referring Physician/Provider: Melburn Popper, MD ?Date: 02/05/2022 ?Duration: 21.23 mins ? ?Patient history: 56 year old female with no significant past medical history who presented with dizziness and concern for loss of consciousness. EEG to evaluate for seizure.  ? ?Level of alertness: Awake, asleep ? ?AEDs during EEG study: None ? ?Technical aspects: This EEG study was done with scalp electrodes positioned according to the 10-20 International system of electrode placement. Electrical activity was acquired at a sampling rate of 500Hz  and reviewed with a high frequency filter of 70Hz  and a low frequency filter of 1Hz . EEG data were recorded continuously and digitally stored.  ? ?Description: The posterior dominant rhythm consists of 9 Hz activity of moderate voltage (25-35 uV) seen predominantly in posterior head regions, symmetric and reactive to eye opening and eye closing. Sleep was characterized by vertex waves, sleep spindles (12 to 14 Hz), maximal frontocentral region. Hyperventilation and photic stimulation were not performed.    ? ?IMPRESSION: ?This study is within normal limits. No seizures or epileptiform discharges were seen throughout the recording. ? ?Lora Havens  ? ?

## 2022-02-05 NOTE — Progress Notes (Signed)
EEG complete - results pending 

## 2024-04-26 ENCOUNTER — Other Ambulatory Visit: Payer: Self-pay | Admitting: Family Medicine

## 2024-04-26 ENCOUNTER — Ambulatory Visit: Payer: Self-pay

## 2024-04-26 DIAGNOSIS — M25542 Pain in joints of left hand: Secondary | ICD-10-CM

## 2024-04-26 DIAGNOSIS — R52 Pain, unspecified: Secondary | ICD-10-CM

## 2024-04-26 DIAGNOSIS — M25532 Pain in left wrist: Secondary | ICD-10-CM

## 2024-04-26 DIAGNOSIS — W19XXXA Unspecified fall, initial encounter: Secondary | ICD-10-CM

## 2024-05-10 ENCOUNTER — Encounter: Payer: Self-pay | Admitting: Family Medicine

## 2024-06-12 ENCOUNTER — Ambulatory Visit (INDEPENDENT_AMBULATORY_CARE_PROVIDER_SITE_OTHER): Admitting: Orthopedic Surgery

## 2024-06-12 ENCOUNTER — Other Ambulatory Visit (INDEPENDENT_AMBULATORY_CARE_PROVIDER_SITE_OTHER): Payer: Self-pay

## 2024-06-12 DIAGNOSIS — M654 Radial styloid tenosynovitis [de Quervain]: Secondary | ICD-10-CM | POA: Diagnosis not present

## 2024-06-12 DIAGNOSIS — M25532 Pain in left wrist: Secondary | ICD-10-CM

## 2024-06-12 DIAGNOSIS — M25531 Pain in right wrist: Secondary | ICD-10-CM

## 2024-06-12 MED ORDER — BETAMETHASONE SOD PHOS & ACET 6 (3-3) MG/ML IJ SUSP
6.0000 mg | INTRAMUSCULAR | Status: AC | PRN
  Administered 2024-06-12: 6 mg via INTRA_ARTICULAR

## 2024-06-12 MED ORDER — LIDOCAINE HCL 1 % IJ SOLN
1.0000 mL | INTRAMUSCULAR | Status: AC | PRN
  Administered 2024-06-12: 1 mL

## 2024-06-12 NOTE — Progress Notes (Signed)
 Mallory Washington - 58 y.o. female MRN [id number]  Date of birth: 12-11-1965  Office Visit Note: Visit Date: 06/12/2024 PCP: Patient, No Pcp Per Referred by: No ref. provider found  Subjective: No chief complaint on file.  HPI: Mallory Washington is a pleasant 58 y.o. female who presents today for evaluation of an injury sustained approximately 1 month prior.  Injury back and described as a fall forwards, broke her fall with the hands.  Today, sure pain is isolated mostly to the left radial sided wrist with radiation into the thumb and distal forearm region.  She has been wearing a thumb spica brace without significant relief.  Pertinent ROS were reviewed with the patient and found to be negative unless otherwise specified above in HPI.   Visit Reason: Left wrist radial sided pain Duration of symptoms: 1 month  Hand dominance: right Occupation: packer  Diabetic: No Smoking: No Heart/Lung History: none Blood Thinners: none   Prior Testing/EMG: x-rays 04/26/24 Injections (Date): yes, long time ago (20+ years) Treatments: thumb spica brace Prior Surgery: none  Assessment & Plan: Visit Diagnoses:  1. De Quervain's tenosynovitis, left   2. Pain in left wrist   3. Pain in right wrist     Plan: Extensive discussion was had with the patient today regarding her left wrist radial sided pain.  Patient has signs and symptoms consistent with de Quervain's tenosynovitis.  We discussed the underlying etiology and pathophysiology of this condition as well as treatment modalities ranging from conservative to surgical.  From a conservative standpoint, we discussed activity modification, anti-inflammatory medication both topical and oral, bracing, therapy and cortisone injections.  From a surgical standpoint, I explained to patient that we can perform right wrist first extensor compartment release should symptoms remain affected conservative care.  At this juncture, she would like to proceed with  cortisone injection to the left wrist first extensor compartment for symptom relief.  Will also transition her to a Comfort Cool brace for improved compliance.  Follow-up in 6 weeks.  Work note was provided today, we will keep a 5 pound lifting restriction for the left hand currently.  At her next visit, we can likely advance her activities.  As for the right wrist, this appears to be more overuse injury with some extensor tendinitis throughout the dorsal aspect of hand and wrist.  No significant interventions required for that side currently.  X-rays were reviewed which were unremarkable.  I spent 45 minutes in the care of this patient today including review of previous documentation, imaging obtained, face-to-face time discussing all options regarding treatment and documenting the encounter.    Follow-up: No follow-ups on file.   Meds & Orders: No orders of the defined types were placed in this encounter.   Orders Placed This Encounter  Procedures   XR Wrist Complete Right   XR Wrist Complete Left     Procedures: Hand/UE Inj: L extensor compartment 1 for de Quervain's tenosynovitis on 06/12/2024 2:24 PM Indications: pain Details: 22 G needle Medications: 1 mL lidocaine  1 %; 6 mg betamethasone  acetate-betamethasone  sodium phosphate 6 (3-3) MG/ML Outcome: tolerated well, no immediate complications Procedure, treatment alternatives, risks and benefits explained, specific risks discussed. Consent was given by the patient.          Clinical History: No specialty comments available.  She reports that she has never smoked. She has never used smokeless tobacco. No results for input(s): HGBA1C, LABURIC in the last 8760 hours.  Objective:   Vital Signs:  LMP 02/03/2016 (Approximate)   Physical Exam  Gen: Well-appearing, in no acute distress; non-toxic CV: Regular Rate. Well-perfused. Warm.  Resp: Breathing unlabored on room air; no wheezing. Psych: Fluid speech in conversation;  appropriate affect; normal thought process  Ortho Exam General: Patient is well appearing and in no distress.   Skin and Muscle: No skin changes are apparent to upper extremities.  Muscle bulk and contour normal, no signs of atrophy.     Range of Motion and Palpation Tests: Mobility is full about the elbows with flexion and extension.  Forearm supination and pronation are 85/85 bilaterally.  Wrist flexion/extension is 75/65 right, wrist flexion/extension limited secondary to pain left side, approximately 45/45.  Digital flexion and extension are full.    No cords or nodules are palpated.  No triggering is observed.   Finklestein test is positive left side, significant pain with associated swelling and tenderness.    Neurologic, Vascular, Motor: Sensation is intact to light touch in the median/radial/ulnar distributions.   Fingers pink and well perfused.  Capillary refill is brisk.     Imaging: XR Wrist Complete Left Result Date: 06/12/2024 There is no evidence of fracture or dislocation. There is no evidence of arthropathy or other focal bone abnormality. Soft tissues are unremarkable.   XR Wrist Complete Right Result Date: 06/12/2024 There is no evidence of fracture or dislocation. There is no evidence of arthropathy or other focal bone abnormality. Soft tissues are unremarkable.    Past Medical/Family/Surgical/Social History: Medications & Allergies reviewed per EMR, new medications updated. There are no active problems to display for this patient.  Past Medical History:  Diagnosis Date   Anemia 2010   from menometrorrhagia at the time   Family History  Problem Relation Age of Onset   Cancer Father        prostate cancer   Diabetes Sister    Past Surgical History:  Procedure Laterality Date   kidney stone removal Right 2010   Social History   Occupational History   Occupation: Product manager at [employer]  Tobacco Use   Smoking status: Never   Smokeless  tobacco: Never  Vaping Use   Vaping status: Never Used  Substance and Sexual Activity   Alcohol use: No    Alcohol/week: 0.0 standard drinks of alcohol   Drug use: No   Sexual activity: Never    Mackensey Bolte Estela) Arlinda, M.D. [facility], Hand Surgery

## 2024-07-24 ENCOUNTER — Ambulatory Visit (INDEPENDENT_AMBULATORY_CARE_PROVIDER_SITE_OTHER): Admitting: Orthopedic Surgery

## 2024-07-24 DIAGNOSIS — R202 Paresthesia of skin: Secondary | ICD-10-CM | POA: Diagnosis not present

## 2024-07-24 DIAGNOSIS — M654 Radial styloid tenosynovitis [de Quervain]: Secondary | ICD-10-CM | POA: Diagnosis not present

## 2024-07-24 DIAGNOSIS — R200 Anesthesia of skin: Secondary | ICD-10-CM

## 2024-07-24 NOTE — Progress Notes (Signed)
 Left De Quervain's Injection- 06/12/24 6 wk follow up

## 2024-07-24 NOTE — Progress Notes (Signed)
 Mallory Washington - 58 y.o. female MRN [id number]  Date of birth: [birth year]  Office Visit Note: Visit Date: 07/24/2024 PCP: Patient, No Pcp Per Referred by: No ref. provider found  Subjective: No chief complaint on file.  HPI: Mallory Washington is a pleasant 58 y.o. female who returns today for evaluation of an injury sustained approximately 2 month prior.  Injury back and described as a fall forwards, broke her fall with the hands.  At her most recent visit approximate 6 weeks prior, she was experiencing pain along the radial aspect of the left wrist consistent with de Quervain's tenosynovitis.  She underwent injection at that time with mild relief, was also placed into a Comfort Cool brace which she has been utilizing.  Today, she is describing progressive numbness and tingling throughout the left hand with associated nocturnal symptoms.  Pertinent ROS were reviewed with the patient and found to be negative unless otherwise specified above in HPI.   Visit Reason: Left wrist radial sided pain with associated numbness and tingling radial sided digits Duration of symptoms: 2 month  Hand dominance: right Occupation: packer  Diabetic: No Smoking: No Heart/Lung History: none Blood Thinners: none   Prior Testing/EMG: x-rays 04/26/24 Injections (Date): yes, long time ago (20+ years) Treatments: thumb spica brace Prior Surgery: none  Assessment & Plan: Visit Diagnoses:  1. Numbness and tingling in left hand   2. De Quervain's tenosynovitis, left     Plan: Based on examination today, I am concerned that there could be some developing carpal tunnel syndrome throughout the left hand and wrist.  I would like to further investigate this with electrodiagnostic study of the left upper extremity to better delineate any potential nerve pathology.  This may better help explain why her symptoms remain refractory to conservative care for the de Quervain's tenosynovitis and have evolved now to a  more numbness and tingling type picture.  We discussed the underlying etiology and pathophysiology of carpal tunnel syndrome as well as treatment modalities.  She will undergo the electrodiagnostic study as instructed to the left upper extremity and return to me after this for review of the results and discussion of next appropriate steps.  Continue with Comfort Cool bracing for now.  Follow-up: No follow-ups on file.   Meds & Orders: No orders of the defined types were placed in this encounter.   Orders Placed This Encounter  Procedures   Ambulatory referral to Physical Medicine Rehab     Procedures: No procedures performed      Clinical History: No specialty comments available.  She reports that she has never smoked. She has never used smokeless tobacco. No results for input(s): HGBA1C, LABURIC in the last 8760 hours.  Objective:   Vital Signs: LMP 02/03/2016 (Approximate)   Physical Exam  Gen: Well-appearing, in no acute distress; non-toxic CV: Regular Rate. Well-perfused. Warm.  Resp: Breathing unlabored on room air; no wheezing. Psych: Fluid speech in conversation; appropriate affect; normal thought process  Ortho Exam General: Patient is well appearing and in no distress.   Skin and Muscle: No skin changes are apparent to upper extremities.  Muscle bulk and contour normal, no signs of atrophy.     Range of Motion and Palpation Tests: Mobility is full about the elbows with flexion and extension.  Forearm supination and pronation are 85/85 bilaterally.  Wrist flexion/extension is 75/65 right, wrist flexion/extension limited secondary to pain left side, approximately 45/45.  Digital flexion and extension are full.    No  cords or nodules are palpated.  No triggering is observed.   Finklestein test is positive left side, significant pain with associated swelling and tenderness.    Neurologic, Vascular, Motor: Sensation is diminished to light touch in the median nerve  distribution, positive Tinel's left carpal tunnel.  Positive Phalen's left wrist, positive Durkan's compression left side.   Imaging: No results found.   Past Medical/Family/Surgical/Social History: Medications & Allergies reviewed per EMR, new medications updated. There are no active problems to display for this patient.  Past Medical History:  Diagnosis Date   Anemia 2010   from menometrorrhagia at the time   Family History  Problem Relation Age of Onset   Cancer Father        prostate cancer   Diabetes Sister    Past Surgical History:  Procedure Laterality Date   kidney stone removal Right 2010   Social History   Occupational History   Occupation: Product manager at [employer]  Tobacco Use   Smoking status: Never   Smokeless tobacco: Never  Vaping Use   Vaping status: Never Used  Substance and Sexual Activity   Alcohol use: No    Alcohol/week: 0.0 standard drinks of alcohol   Drug use: No   Sexual activity: Never    Lynea Rollison Estela) Arlinda, M.D.  [facility], Hand [facility]

## 2024-07-28 ENCOUNTER — Telehealth: Payer: Self-pay | Admitting: Orthopedic Surgery

## 2024-07-28 NOTE — Telephone Encounter (Signed)
 Patient called and said she needs a letter note for her employer. She stated that she was supposed to get an email about it. CB#[phone]

## 2024-08-10 ENCOUNTER — Other Ambulatory Visit: Payer: Self-pay | Admitting: Orthopedic Surgery

## 2024-08-10 NOTE — Telephone Encounter (Signed)
 Note in chart. Unable to send via mychart.
